# Patient Record
Sex: Male | Born: 1970 | Race: Black or African American | Hispanic: No | Marital: Single | Smoking: Former smoker
Health system: Southern US, Community
[De-identification: ages and names within clinical notes are randomized; demographics above are authoritative.]

## PROBLEM LIST (undated history)

## (undated) DIAGNOSIS — E785 Hyperlipidemia, unspecified: Secondary | ICD-10-CM

## (undated) DIAGNOSIS — I10 Essential (primary) hypertension: Secondary | ICD-10-CM

## (undated) DIAGNOSIS — G7100 Muscular dystrophy, unspecified: Secondary | ICD-10-CM

## (undated) DIAGNOSIS — I82409 Acute embolism and thrombosis of unspecified deep veins of unspecified lower extremity: Secondary | ICD-10-CM

## (undated) HISTORY — PX: HIP SURGERY: SHX245

---

## 2014-07-20 ENCOUNTER — Emergency Department (HOSPITAL_BASED_OUTPATIENT_CLINIC_OR_DEPARTMENT_OTHER): Payer: Medicare Other

## 2014-07-20 ENCOUNTER — Emergency Department (HOSPITAL_BASED_OUTPATIENT_CLINIC_OR_DEPARTMENT_OTHER)
Admission: EM | Admit: 2014-07-20 | Discharge: 2014-07-20 | Disposition: A | Discharge status: Home / Self Care | Payer: Medicare Other | Attending: Emergency Medicine | Admitting: Emergency Medicine

## 2014-07-20 ENCOUNTER — Encounter (HOSPITAL_BASED_OUTPATIENT_CLINIC_OR_DEPARTMENT_OTHER): Payer: Self-pay | Admitting: Emergency Medicine

## 2014-07-20 DIAGNOSIS — M25462 Effusion, left knee: Secondary | ICD-10-CM

## 2014-07-20 DIAGNOSIS — F17200 Nicotine dependence, unspecified, uncomplicated: Secondary | ICD-10-CM | POA: Diagnosis not present

## 2014-07-20 DIAGNOSIS — Z86718 Personal history of other venous thrombosis and embolism: Secondary | ICD-10-CM | POA: Diagnosis not present

## 2014-07-20 DIAGNOSIS — Z7901 Long term (current) use of anticoagulants: Secondary | ICD-10-CM | POA: Diagnosis not present

## 2014-07-20 DIAGNOSIS — Z8669 Personal history of other diseases of the nervous system and sense organs: Secondary | ICD-10-CM | POA: Diagnosis not present

## 2014-07-20 DIAGNOSIS — M25469 Effusion, unspecified knee: Secondary | ICD-10-CM | POA: Diagnosis not present

## 2014-07-20 DIAGNOSIS — M25569 Pain in unspecified knee: Secondary | ICD-10-CM | POA: Diagnosis present

## 2014-07-20 HISTORY — DX: Muscular dystrophy, unspecified: G71.00

## 2014-07-20 HISTORY — DX: Acute embolism and thrombosis of unspecified deep veins of unspecified lower extremity: I82.409

## 2014-07-20 MED ORDER — OXYCODONE-ACETAMINOPHEN 5-325 MG PO TABS
ORAL_TABLET | ORAL | Status: DC
Start: 2014-07-20 — End: 2016-10-20

## 2014-07-20 MED ORDER — LIDOCAINE-EPINEPHRINE 2 %-1:100000 IJ SOLN
20.0000 mL | Freq: Once | INTRAMUSCULAR | Status: DC
  Filled 2014-07-20: qty 1

## 2014-07-20 NOTE — Discharge Instructions (Signed)
Use your wheelchair and try not to bear weight on the left knee.  Take percocet for breakthrough pain, do not drink alcohol, drive, care for children or do other critical tasks while taking percocet.  Please follow with your primary care doctor in the next 2 days for a check-up. They must obtain records for further management.   Do not hesitate to return to the Emergency Department for any new, worsening or concerning symptoms.   Knee Effusion The medical term for having fluid in your knee is effusion. This is often due to an internal derangement of the knee. This means something is wrong inside the knee. Some of the causes of fluid in the knee may be torn cartilage, a torn ligament, or bleeding into the joint from an injury. Your knee is likely more difficult to bend and move. This is often because there is increased pain and pressure in the joint. The time it takes for recovery from a knee effusion depends on different factors, including:   Type of injury.  Your age.  Physical and medical conditions.  Rehabilitation Strategies. How long you will be away from your normal activities will depend on what kind of knee problem you have and how much damage is present. Your knee has two types of cartilage. Articular cartilage covers the bone ends and lets your knee bend and move smoothly. Two menisci, thick pads of cartilage that form a rim inside the joint, help absorb shock and stabilize your knee. Ligaments bind the bones together and support your knee joint. Muscles move the joint, help support your knee, and take stress off the joint itself. CAUSES  Often an effusion in the knee is caused by an injury to one of the menisci. This is often a tear in the cartilage. Recovery after a meniscus injury depends on how much meniscus is damaged and whether you have damaged other knee tissue. Small tears may heal on their own with conservative treatment. Conservative means rest, limited weight bearing  activity and muscle strengthening exercises. Your recovery may take up to 6 weeks.  TREATMENT  Larger tears may require surgery. Meniscus injuries may be treated during arthroscopy. Arthroscopy is a procedure in which your surgeon uses a small telescope like instrument to look in your knee. Your caregiver can make a more accurate diagnosis (learning what is wrong) by performing an arthroscopic procedure. If your injury is on the inner margin of the meniscus, your surgeon may trim the meniscus back to a smooth rim. In other cases your surgeon will try to repair a damaged meniscus with stitches (sutures). This may make rehabilitation take longer, but may provide better long term result by helping your knee keep its shock absorption capabilities. Ligaments which are completely torn usually require surgery for repair. HOME CARE INSTRUCTIONS  Use crutches as instructed.  If a brace is applied, use as directed.  Once you are home, an ice pack applied to your swollen knee may help with discomfort and help decrease swelling.  Keep your knee raised (elevated) when you are not up and around or on crutches.  Only take over-the-counter or prescription medicines for pain, discomfort, or fever as directed by your caregiver.  Your caregivers will help with instructions for rehabilitation of your knee. This often includes strengthening exercises.  You may resume a normal diet and activities as directed. SEEK MEDICAL CARE IF:   There is increased swelling in your knee.  You notice redness, swelling, or increasing pain in your knee.  An unexplained oral temperature above 102 F (38.9 C) develops. SEEK IMMEDIATE MEDICAL CARE IF:   You develop a rash.  You have difficulty breathing.  You have any allergic reactions from medications you may have been given.  There is severe pain with any motion of the knee. MAKE SURE YOU:   Understand these instructions.  Will watch your condition.  Will get  help right away if you are not doing well or get worse. Document Released: 01/21/2004 Document Revised: 01/23/2012 Document Reviewed: 03/26/2008 [facility] Patient Information 2015 [place], [place]. This information is not intended to replace advice given to you by your health care provider. Make sure you discuss any questions you have with your health care provider.

## 2014-07-20 NOTE — ED Provider Notes (Signed)
CSN: [id number]     Arrival date & time 07/20/14  1204 History   First MD Initiated Contact with Patient 07/20/14 1449     Chief Complaint  Patient presents with  . Knee Pain     (Consider location/radiation/quality/duration/timing/severity/associated sxs/prior Treatment) HPI  Arion Shankles is a 43 y.o. male complaining of left knee pain and swelling onset in the last day. Patient denies trauma, reduced range of motion, fever, chills, swelling to other joints, history of issues with the left knee. Patient is anticoagulated for DVT. He had his INR checked 2 hours prior to arrival and states it was 2.6.  Past Medical History  Diagnosis Date  . Muscular dystrophy   . Acute DVT (deep venous thrombosis)    Past Surgical History  Procedure Laterality Date  . Hip surgery     No family history on file. History  Substance Use Topics  . Smoking status: Current Every Day Smoker -- 0.50 packs/day  . Smokeless tobacco: Not on file  . Alcohol Use: Yes    Review of Systems  10 systems reviewed and found to be negative, except as noted in the HPI.  Allergies  Review of patient's allergies indicates no known allergies.  Home Medications   Prior to Admission medications   Medication Sig Start Date End Date Taking? Authorizing Provider  warfarin (COUMADIN) 7.5 MG tablet Take 7.5 mg by mouth daily.   Yes Historical Provider, MD  oxyCODONE-acetaminophen (PERCOCET/ROXICET) 5-325 MG per tablet 1 to 2 tabs PO q6hrs  PRN for pain 07/20/14   Joni Reining Casimiro Lienhard, PA-C   BP 128/69  Pulse 63  Temp(Src) 98.5 F (36.9 C) (Oral)  Resp 16  Ht  (1.626 m)  Wt 200 lb 11.2 oz (91.037 kg)  BMI 34.43 kg/m2  SpO2 98% Physical Exam  Nursing note and vitals reviewed. Constitutional: He is oriented to person, place, and time. He appears well-developed and well-nourished. No distress.  HENT:  Head: Normocephalic and atraumatic.  Mouth/Throat: Oropharynx is clear and moist.  Eyes: Conjunctivae and EOM  are normal. Pupils are equal, round, and reactive to light.  Neck: Normal range of motion.  Cardiovascular: Normal rate, regular rhythm and intact distal pulses.   Pulmonary/Chest: Effort normal and breath sounds normal. No stridor. No respiratory distress. He has no wheezes. He has no rales. He exhibits no tenderness.  Abdominal: Soft. Bowel sounds are normal. He exhibits no distension and no mass. There is no tenderness. There is no rebound and no guarding.  Musculoskeletal: Normal range of motion. He exhibits tenderness. He exhibits no edema.  Large effusion to left knee, full active range of motion with mild pain at extreme flexion. There is trace warmth to the knee with no overlying skin changes or signs of trauma  Neurological: He is alert and oriented to person, place, and time.  Psychiatric: He has a normal mood and affect.    ED Course  Procedures (including critical care time) Labs Review Labs Reviewed - No data to display  Imaging Review Dg Knee 1-2 Views Left  07/20/2014   CLINICAL DATA:  Patient awoke with this morning with left knee pain and swelling. No known injury.  EXAM: LEFT KNEE - 1-2 VIEW  COMPARISON:  None.  FINDINGS: No fracture or dislocation. There is moderate to severe tricompartmental degenerative change of the knee, likely worse within the medial and lateral compartments, with joint space loss, articular surface irregularity, subchondral sclerosis and osteophytosis. No evidence of chondrocalcinosis. There is a moderate  to large sized knee joint effusion. Regional soft tissues appear normal. No radiopaque foreign body.  IMPRESSION: 1. Moderate to large sized knee joint effusion.  No fracture. 2. Moderate to severe tricompartmental degenerative change of the knee.   Electronically Signed   By: Simonne Come M.D.   On: 07/20/2014 12:59     EKG Interpretation None      MDM   Final diagnoses:  Knee effusion, left    Filed Vitals:   07/20/14 1233 07/20/14 1609   BP: 130/84 128/69  Pulse: 56 63  Temp: 98 F (36.7 C) 98.5 F (36.9 C)  TempSrc: Oral Oral  Resp: 20 16  Height:  (1.626 m)   Weight: 200 lb 11.2 oz (91.037 kg)   SpO2: 97% 98%    Kalem Rockwell is a 43 y.o. male presenting with effusion to left knee. No prior history of trauma or issues with his knee. Patient is anticoagulated for DVT. INR taken this morning was therapeutic at 2.6. Patient has excellent range of motion. I doubt this is a septic joint. I am hesitant to tap the joint therapeutically as he is anticoagulated. I will give the patient a referral to orthopedics for further evaluation. Patient has a wheelchair at home and declines crutches. He will be sent home with pain control medications and instructed to return for systemic signs of infection or pain with range of motion.  Evaluation does not show pathology that would require ongoing emergent intervention or inpatient treatment. Pt is hemodynamically stable and mentating appropriately. Discussed findings and plan with patient/guardian, who agrees with care plan. All questions answered. Return precautions discussed and outpatient follow up given.   Discharge Medication List as of 07/20/2014  4:03 PM    START taking these medications   Details  oxyCODONE-acetaminophen (PERCOCET/ROXICET) 5-325 MG per tablet 1 to 2 tabs PO q6hrs  PRN for pain, Print             Wynetta Emery, PA-C 07/22/14 0111

## 2014-07-20 NOTE — ED Notes (Signed)
Pt here for pain and swelling in left knee that began today.  Pt denies any trauma, HX of MD

## 2014-07-23 NOTE — ED Provider Notes (Signed)
Medical screening examination/treatment/procedure(s) were performed by non-physician practitioner and as supervising physician I was immediately available for consultation/collaboration.   EKG Interpretation None        Kiamesha Samet T Yilin Weedon, MD 07/23/14 0716 

## 2015-09-18 IMAGING — CR DG KNEE 1-2V*L*
2 series · 2 of 2 positions shown · non-contrast
Comparison: None.

CLINICAL DATA: Patient awoke with this morning with left knee pain
and swelling. No known injury.

EXAM:
LEFT KNEE - 1-2 VIEW

[t knee ap left]
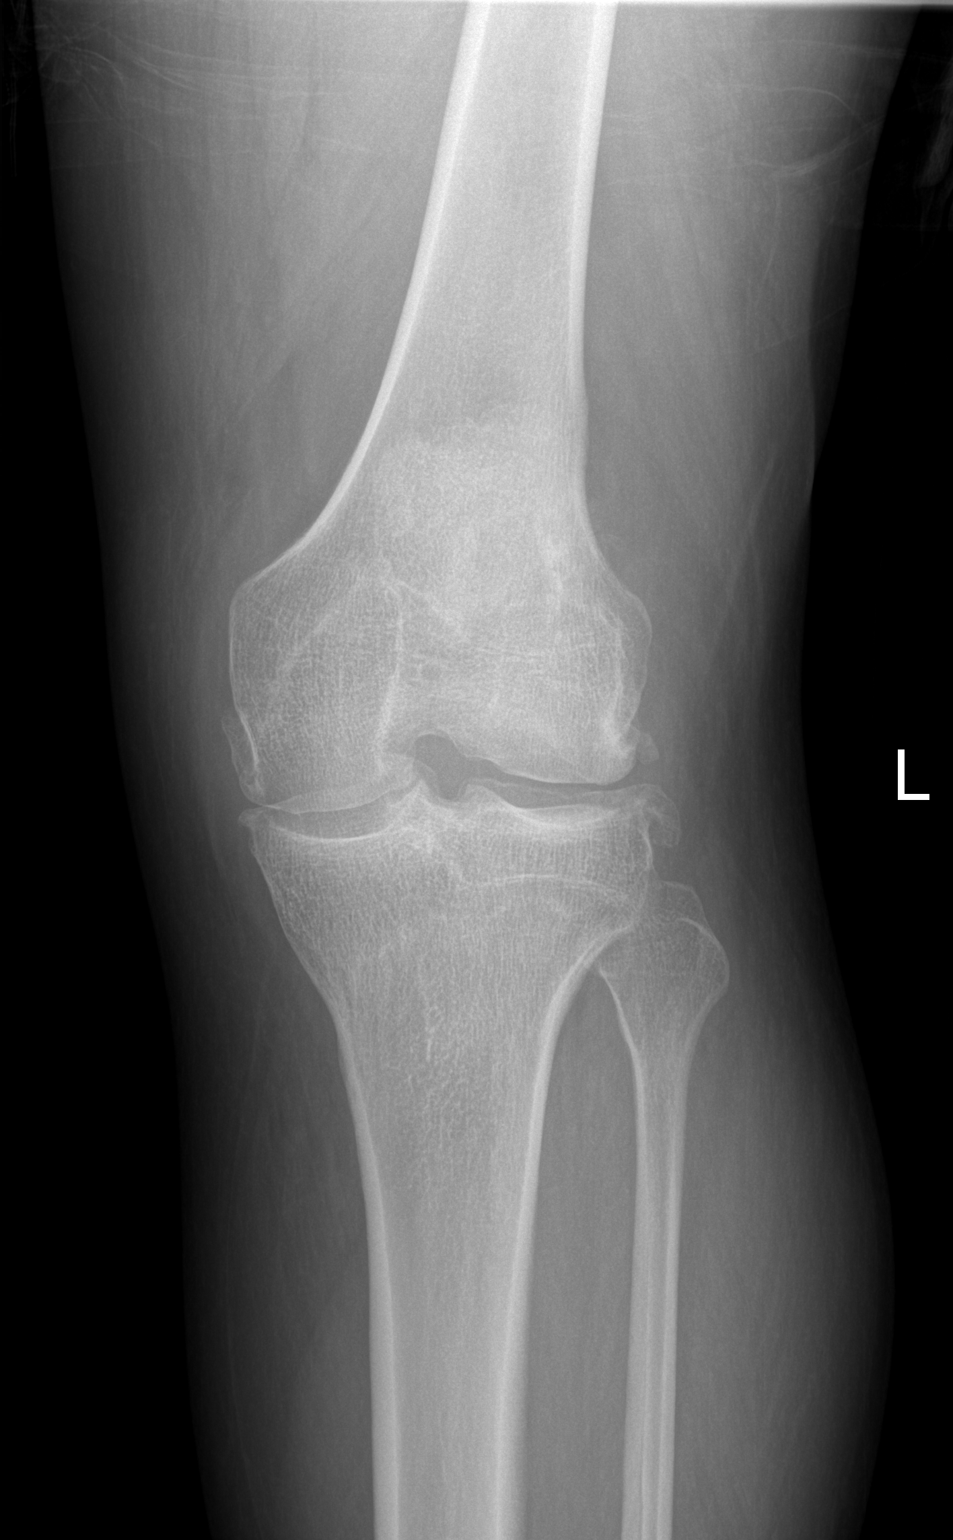

[t knee lat left]
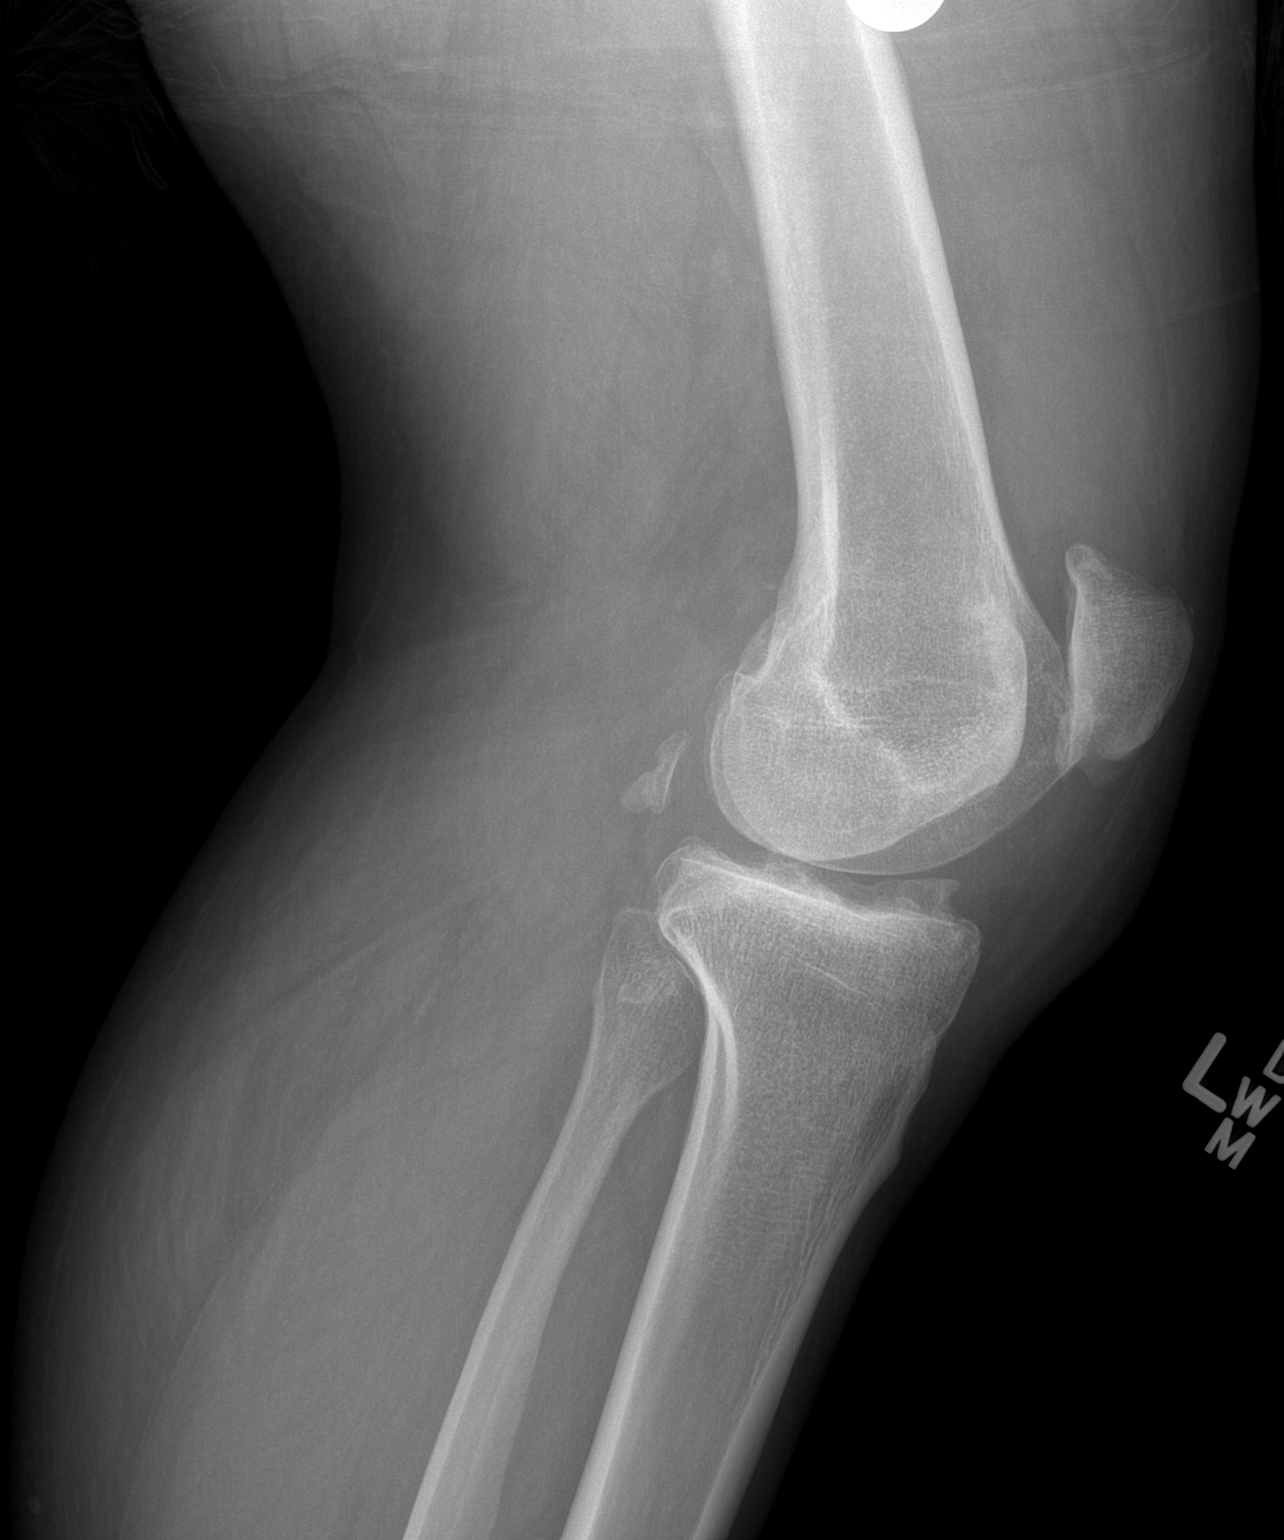

[2 of 2 positions shown; findings below may reference images not displayed]

FINDINGS: No fracture or dislocation. There is moderate to severe
tricompartmental degenerative change of the knee, likely worse
within the medial and lateral compartments, with joint space loss,
articular surface irregularity, subchondral sclerosis and
osteophytosis. No evidence of chondrocalcinosis. There is a moderate
to large sized knee joint effusion. Regional soft tissues appear
normal. No radiopaque foreign body.
IMPRESSION: 1. Moderate to large sized knee joint effusion.  No fracture.
2. Moderate to severe tricompartmental degenerative change of the
knee.

## 2016-05-04 ENCOUNTER — Ambulatory Visit (INDEPENDENT_AMBULATORY_CARE_PROVIDER_SITE_OTHER): Payer: Medicare Other | Admitting: Podiatry

## 2016-05-04 ENCOUNTER — Encounter: Payer: Self-pay | Admitting: Podiatry

## 2016-05-04 VITALS — BP 121/77 | HR 64 | Ht 64.0 in | Wt 176.0 lb

## 2016-05-04 DIAGNOSIS — M79606 Pain in leg, unspecified: Secondary | ICD-10-CM

## 2016-05-04 DIAGNOSIS — L609 Nail disorder, unspecified: Secondary | ICD-10-CM | POA: Diagnosis not present

## 2016-05-04 DIAGNOSIS — M79673 Pain in unspecified foot: Secondary | ICD-10-CM | POA: Diagnosis not present

## 2016-05-04 DIAGNOSIS — L608 Other nail disorders: Secondary | ICD-10-CM

## 2016-05-04 DIAGNOSIS — L089 Local infection of the skin and subcutaneous tissue, unspecified: Secondary | ICD-10-CM | POA: Diagnosis not present

## 2016-05-04 DIAGNOSIS — S90829A Blister (nonthermal), unspecified foot, initial encounter: Secondary | ICD-10-CM

## 2016-05-04 DIAGNOSIS — L570 Actinic keratosis: Secondary | ICD-10-CM | POA: Insufficient documentation

## 2016-05-04 NOTE — Patient Instructions (Signed)
Pain in right foot. Noted of infected callus. Infected callus debrided and dressed under right 2nd MPJ. Keep the dressing intact. Return this Friday for dressing change and nail surgery on 4th toe left foot.

## 2016-05-04 NOTE — Progress Notes (Signed)
SUBJECTIVE: 45 y.o. year old male presents complaining of having severe pain from a lesion under the right foot. The left foot has problem with toe nail on 4th. 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 Was on Warfarin but temporarily off now.   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  OBJECTIVE: DERMATOLOGIC EXAMINATION: Painful hard callus under 2nd MPJ right with pain and swelling. Upon removal of outer layer, pus drained. Pus localized just under the 2nd MPJ right foot. No ascending cellulitis noted. Residual nail growth in circular porokeratotic fashion at lateral borde rof 4th digit left following nail plate removal, done in 2 years ago.  VASCULAR EXAMINATION OF LOWER LIMBS: Pedal pulses: All pedal pulses are palpable with normal pulsation.  Temperature gradient from tibial crest to dorsum of foot is within normal bilateral.  NEUROLOGIC EXAMINATION OF THE LOWER LIMBS: Positive for numbness on both feet.   MUSCULOSKELETAL EXAMINATION: Positive for enlarged first metatarsal head with abducted hallux.  Contracted lesser digits bilateral.  ASSESSMENT: Infected callus right foot. Deformed abnormal nail growth following removal of nail plate 4th left.  PLAN: Infected callus debrided, pus drained under 2nd MPJ right done. Cleansed with Iodine and Iodine gauze dressing applied.  Deformed nails debrided, pain was relieved. Return in 2 days. Will remove abnormal nail growth on left foot on next visit.

## 2016-05-06 ENCOUNTER — Ambulatory Visit: Payer: Medicare Other | Admitting: Podiatry

## 2016-07-07 ENCOUNTER — Encounter: Payer: Self-pay | Admitting: Podiatry

## 2016-07-07 ENCOUNTER — Ambulatory Visit (INDEPENDENT_AMBULATORY_CARE_PROVIDER_SITE_OTHER): Payer: Medicare Other | Admitting: Podiatry

## 2016-07-07 VITALS — BP 109/72 | HR 58

## 2016-07-07 DIAGNOSIS — L97521 Non-pressure chronic ulcer of other part of left foot limited to breakdown of skin: Secondary | ICD-10-CM

## 2016-07-07 DIAGNOSIS — L609 Nail disorder, unspecified: Secondary | ICD-10-CM

## 2016-07-07 DIAGNOSIS — L089 Local infection of the skin and subcutaneous tissue, unspecified: Secondary | ICD-10-CM

## 2016-07-07 DIAGNOSIS — M79606 Pain in leg, unspecified: Secondary | ICD-10-CM

## 2016-07-07 DIAGNOSIS — L608 Other nail disorders: Secondary | ICD-10-CM

## 2016-07-07 DIAGNOSIS — M79673 Pain in unspecified foot: Secondary | ICD-10-CM | POA: Diagnosis not present

## 2016-07-07 NOTE — Patient Instructions (Signed)
Seen for painful nails and calluses. All nails and calluses debrided. Return in 2 months or as needed.

## 2016-07-07 NOTE — Progress Notes (Signed)
SUBJECTIVE: 45 y.o. year old male presents complaining of having severe pain under the 4th toe left and a lesion under the right foot. Hardly can walk due to pain under the toe.  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 Was on Warfarin but temporarily off now.   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  OBJECTIVE: DERMATOLOGIC EXAMINATION: Painful hard callus under 2nd MPJ right with pain, pre ulcerative.  Deformed nail with porokeratotic tissue embedded at lateral corner. No ascending cellulitis noted. Residual nail growth in circular porokeratotic fashion at lateral borde rof 4th digit left following nail plate removal, done in 2 years ago.  VASCULAR EXAMINATION OF LOWER LIMBS: Pedal pulses: All pedal pulses are palpable with normal pulsation.  Temperature gradient from tibial crest to dorsum of foot is within normal bilateral.  NEUROLOGIC EXAMINATION OF THE LOWER LIMBS: Positive for numbness on both feet.   MUSCULOSKELETAL EXAMINATION: Positive for enlarged first metatarsal head with abducted hallux.  Contracted lesser digits bilateral.  ASSESSMENT: Pre ulcerative callus under 2nd MPJ right foot. Deformed abnormal nail growth with severe pain. Thick dystrophic nails x 10.  PLAN: Pre ulcerative callus debrided. Deformed nails debrided, pain was relieved. All nails debrided. Return in 2 month.

## 2016-08-24 ENCOUNTER — Encounter: Payer: Self-pay | Admitting: Podiatry

## 2016-08-24 ENCOUNTER — Ambulatory Visit (INDEPENDENT_AMBULATORY_CARE_PROVIDER_SITE_OTHER): Payer: Medicare Other | Admitting: Podiatry

## 2016-08-24 VITALS — BP 138/83 | HR 58

## 2016-08-24 DIAGNOSIS — B351 Tinea unguium: Secondary | ICD-10-CM | POA: Diagnosis not present

## 2016-08-24 DIAGNOSIS — L97521 Non-pressure chronic ulcer of other part of left foot limited to breakdown of skin: Secondary | ICD-10-CM | POA: Diagnosis not present

## 2016-08-24 DIAGNOSIS — L570 Actinic keratosis: Secondary | ICD-10-CM

## 2016-08-24 DIAGNOSIS — M79673 Pain in unspecified foot: Secondary | ICD-10-CM

## 2016-08-24 DIAGNOSIS — M79606 Pain in leg, unspecified: Secondary | ICD-10-CM

## 2016-08-24 NOTE — Patient Instructions (Signed)
Seen for hypertrophic nails, painful corns and calluses. All nails and calluses (over left bunion area, tip of 4th toe left, ball under 3rd MPJ left, ball under 2nd MPJ) debrided. Return in 2 months or as needed.

## 2016-08-24 NOTE — Progress Notes (Signed)
SUBJECTIVE: 45 y.o.year old malepresents complaining of painful lesion over the left foot bunion, painful calluses on bottom of both feet and nail on 4th toe left. Having difficulty walking due to pain.  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  OBJECTIVE: DERMATOLOGIC EXAMINATION: Painful callus over left bunion, pre ulcerative. Painful hard callus under 2nd MPJ right with pain, pre ulcerative.  Deformed nail with porokeratotic tissue embedded at lateral corner. No open skin lesions. Thick dystrophic nails with fungal debris x 10.  VASCULAR EXAMINATION OF LOWER LIMBS: All pedal pulses are palpable with normal pulsation.  Temperature gradient from tibial crest to dorsum of foot is within normal bilateral.  NEUROLOGIC EXAMINATION OF THE LOWER LIMBS: Positive for numbness on both feet.   MUSCULOSKELETAL EXAMINATION: Severe hallux valgus with bunion painful from skin lesion over the bunion.  Contracted lesser digits bilateral.  ASSESSMENT: Pre ulcerative callus under 2nd MPJ right foot. Pre ulcerative keratotic lesion over left bunion painful in closed in shoes. Deformed abnormal nail growth with severe pain. Thick dystrophic nails x 10.  PLAN: Pre ulcerative callus debrided. All nails debrided. Pain was relieved. Return in 2 month or sooner if needed..Marland Kitchen

## 2016-09-06 ENCOUNTER — Ambulatory Visit: Payer: Medicare Other | Admitting: Podiatry

## 2016-10-11 ENCOUNTER — Ambulatory Visit (INDEPENDENT_AMBULATORY_CARE_PROVIDER_SITE_OTHER): Payer: Medicare Other | Admitting: Podiatry

## 2016-10-11 ENCOUNTER — Encounter: Payer: Self-pay | Admitting: Podiatry

## 2016-10-11 VITALS — BP 130/89 | HR 61 | Wt 180.0 lb

## 2016-10-11 DIAGNOSIS — M21619 Bunion of unspecified foot: Secondary | ICD-10-CM

## 2016-10-11 DIAGNOSIS — M79606 Pain in leg, unspecified: Secondary | ICD-10-CM

## 2016-10-11 DIAGNOSIS — L97521 Non-pressure chronic ulcer of other part of left foot limited to breakdown of skin: Secondary | ICD-10-CM | POA: Diagnosis not present

## 2016-10-11 DIAGNOSIS — M79673 Pain in unspecified foot: Secondary | ICD-10-CM

## 2016-10-11 DIAGNOSIS — M2042 Other hammer toe(s) (acquired), left foot: Secondary | ICD-10-CM

## 2016-10-11 NOTE — Progress Notes (Signed)
SUBJECTIVE: 45 y.o. year old male presents stating that he thinks toes are infected at bunion area and tip of 4th toe left foot.  They are very painful to walk. Patient is seeking a way to prevent further infection and pain. 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 Was on Warfarin but off now.   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  OBJECTIVE: DERMATOLOGIC EXAMINATION: Bleeding callus over left foot bunion. Severely deformed nail at distal lateral 4th digit left. Plantar callus under 2nd MPJ right and under the first MPJ left. VASCULAR EXAMINATION OF LOWER LIMBS: Pedal pulses: All pedal pulses are palpable with normal pulsation.  Temperature gradient from tibial crest to dorsum of foot is within normal bilateral.  NEUROLOGIC EXAMINATION OF THE LOWER LIMBS: Positive for numbness on both feet.   MUSCULOSKELETAL EXAMINATION: Positive for enlarged first metatarsal head with abducted hallux.  Contracted lesser digits with painful distal lesion 4th left.   Radiographic examination reveal enlarged medial aspect of the first metatarsal head L>R, short first metatarsal with increased first intermetatarsal angle bilateral, Fibular sesamoid position at 7 left, 5 right. Contracted lesser digits bilateral.  No soft tissue swelling, no acute osseous changes noted.  ASSESSMENT: Bleeding callus under left foot bunion with dry ulcer. 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  PLAN: All affected lesions debrided. Deformed nails debrided, pain was relieved. [facility] via Flexor tenotomy 4th left foot.

## 2016-10-11 NOTE — Patient Instructions (Signed)
Seen for infected callus. All lesions and nails debrided. Reviewed surgical option for painful bunion and hammer toe 4th digit left foot.  [facility], Flexor tenotomy with excision nail matrix 4th digit left.

## 2016-10-20 ENCOUNTER — Other Ambulatory Visit: Payer: Self-pay | Admitting: Podiatry

## 2016-10-20 MED ORDER — NABUMETONE 500 MG PO TABS: 500.0000 mg | ORAL_TABLET | Freq: Two times a day (BID) | ORAL | 1 refills | Status: AC

## 2016-10-20 MED ORDER — OXYCODONE-ACETAMINOPHEN 5-325 MG PO TABS: ORAL_TABLET | ORAL | 0 refills | Status: AC

## 2016-10-25 ENCOUNTER — Ambulatory Visit: Payer: Medicare Other | Admitting: Podiatry

## 2016-10-27 ENCOUNTER — Encounter: Payer: Medicare Other | Admitting: Podiatry

## 2017-05-30 ENCOUNTER — Ambulatory Visit: Payer: Medicare Other | Admitting: Podiatry

## 2017-07-05 ENCOUNTER — Ambulatory Visit: Payer: Medicare Other | Admitting: Podiatry

## 2017-07-11 ENCOUNTER — Encounter: Payer: Self-pay | Admitting: Podiatry

## 2017-07-11 ENCOUNTER — Ambulatory Visit (INDEPENDENT_AMBULATORY_CARE_PROVIDER_SITE_OTHER): Payer: Medicare Other | Admitting: Podiatry

## 2017-07-11 DIAGNOSIS — M79606 Pain in leg, unspecified: Secondary | ICD-10-CM | POA: Diagnosis not present

## 2017-07-11 DIAGNOSIS — M2042 Other hammer toe(s) (acquired), left foot: Secondary | ICD-10-CM

## 2017-07-11 DIAGNOSIS — B351 Tinea unguium: Secondary | ICD-10-CM | POA: Diagnosis not present

## 2017-07-11 DIAGNOSIS — M21619 Bunion of unspecified foot: Secondary | ICD-10-CM

## 2017-07-11 NOTE — Patient Instructions (Signed)
Seen for painful nails and calluses. All lesions and nails debrided. Return as needed.

## 2017-07-11 NOTE — Progress Notes (Signed)
SUBJECTIVE: 46 y.o.year old malepresents complaining of painful toe nails and calluses.  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 Was on Warfarin but off now.   OBJECTIVE: DERMATOLOGIC EXAMINATION: Thick dystrophic nails x 10. Severely deformed nail at distal lateral 4th digit left. Plantar callus under 2nd MPJ right and under the first MPJ left.  VASCULAR EXAMINATION OF LOWER LIMBS: PAll pedal pulses are palpable with normal pulsation.  Temperature gradient from tibial crest to dorsum of foot is within normal bilateral.  NEUROLOGIC EXAMINATION OF THE LOWER LIMBS: Positive for numbness on both feet.   MUSCULOSKELETAL EXAMINATION: Positive for enlarged first metatarsal head with abducted hallux.  Severe hallux valgus with subluxed first MPJ left. Contracted lesser digits with painful distal lesion 4th left.   ASSESSMENT: Thick dystrophic mycotic nails x 10 Severe plantar calluses under 2nd MPJ bilateral. 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  PLAN: All affected lesions debrided. Deformed nails debrided, pain was relieved. Patient will return as needed.

## 2017-09-14 ENCOUNTER — Ambulatory Visit: Payer: Medicare Other | Admitting: Podiatry

## 2017-12-04 ENCOUNTER — Ambulatory Visit: Payer: Medicare Other | Admitting: Podiatry

## 2017-12-06 ENCOUNTER — Ambulatory Visit (INDEPENDENT_AMBULATORY_CARE_PROVIDER_SITE_OTHER): Payer: Medicare Other | Admitting: Podiatry

## 2017-12-06 ENCOUNTER — Encounter: Payer: Self-pay | Admitting: Podiatry

## 2017-12-06 ENCOUNTER — Ambulatory Visit: Payer: Medicare Other | Admitting: Podiatry

## 2017-12-06 DIAGNOSIS — M79605 Pain in left leg: Secondary | ICD-10-CM | POA: Diagnosis not present

## 2017-12-06 DIAGNOSIS — M21612 Bunion of left foot: Secondary | ICD-10-CM

## 2017-12-06 DIAGNOSIS — M21619 Bunion of unspecified foot: Secondary | ICD-10-CM

## 2017-12-06 DIAGNOSIS — M2042 Other hammer toe(s) (acquired), left foot: Secondary | ICD-10-CM | POA: Diagnosis not present

## 2017-12-06 DIAGNOSIS — L608 Other nail disorders: Secondary | ICD-10-CM

## 2017-12-06 DIAGNOSIS — M79606 Pain in leg, unspecified: Secondary | ICD-10-CM

## 2017-12-06 NOTE — Progress Notes (Signed)
Subjective: 47 y.o. year old male patient presents complaining of painful feet. 4th toe left foot has a growth that is very painful to walk. Patient requests toe nails, corns and calluses trimmed. 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  He had to cancel bunion surgery last year.  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 Was on Warfarin but off now.   Objective: Dermatologic: Thick yellow deformed nails x 10. Porokeratotic lesion mixed with nail matrix at distal lateral 4th left. Multiple plantar porokeratosis bilateral. Vascular: Pedal pulses are all palpable. 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 Severe hallux valgus with bunion on left. Neurologic: All epicritic and tactile sensations grossly intact.  Assessment: Dystrophic mycotic nails x 10. Nail deformity 4th left. HAV with bunion left.  Treatment: All mycotic nails, corns, calluses debrided.  May benefit from office procedure, excision distal lesion 4th left. Patient will schedule office procedure.

## 2017-12-06 NOTE — Patient Instructions (Signed)
Seen for hypertrophic nails, corns and calluses. All nails and painful lesions debrided. May benefit from an office procedure, excision of imbedded nail on 4th toe left foot. Schedule for 30 min office procedure.

## 2017-12-13 ENCOUNTER — Ambulatory Visit: Payer: Medicare Other | Admitting: Podiatry

## 2017-12-19 ENCOUNTER — Ambulatory Visit: Payer: Medicare Other | Admitting: Podiatry

## 2018-08-13 ENCOUNTER — Ambulatory Visit (INDEPENDENT_AMBULATORY_CARE_PROVIDER_SITE_OTHER): Payer: Medicare Other | Admitting: Podiatry

## 2018-08-13 ENCOUNTER — Encounter: Payer: Self-pay | Admitting: Podiatry

## 2018-08-13 DIAGNOSIS — M79606 Pain in leg, unspecified: Secondary | ICD-10-CM | POA: Diagnosis not present

## 2018-08-13 DIAGNOSIS — B351 Tinea unguium: Secondary | ICD-10-CM

## 2018-08-13 NOTE — Patient Instructions (Signed)
Seen for hypertrophic nails and painful calluses. All nails and calluses debrided. Return in 3 months or as needed.  

## 2018-08-13 NOTE — Progress Notes (Signed)
Subjective: 47 y.o. year old male patient presents complaining of painful nails. Patient requests toe nails and calluses trimmed. Denies any new problems.  Objective: Dermatologic: Thick yellow deformed nails x 10. Plantar callus under 2nd MPJ bilateral. Vascular: Pedal pulses are all palpable. Orthopedic: Contracted great toe and lesser digits  Neurologic: All epicritic and tactile sensations grossly intact.  Assessment: Dystrophic mycotic nails x 10. Painful calluses.  Treatment: All mycotic nails, corns, calluses debrided.  Return in 3 months or as needed.

## 2020-07-12 ENCOUNTER — Other Ambulatory Visit: Payer: Self-pay

## 2020-07-12 ENCOUNTER — Encounter (HOSPITAL_BASED_OUTPATIENT_CLINIC_OR_DEPARTMENT_OTHER): Payer: Self-pay

## 2020-07-12 ENCOUNTER — Emergency Department (HOSPITAL_BASED_OUTPATIENT_CLINIC_OR_DEPARTMENT_OTHER)
Admission: EM | Admit: 2020-07-12 | Discharge: 2020-07-12 | Disposition: A | Discharge status: Home / Self Care | Payer: Medicare Other | Attending: Emergency Medicine | Admitting: Emergency Medicine

## 2020-07-12 DIAGNOSIS — F17200 Nicotine dependence, unspecified, uncomplicated: Secondary | ICD-10-CM | POA: Insufficient documentation

## 2020-07-12 DIAGNOSIS — I10 Essential (primary) hypertension: Secondary | ICD-10-CM | POA: Diagnosis present

## 2020-07-12 MED ORDER — HYDROCHLOROTHIAZIDE 12.5 MG PO TABS: 25.0000 mg | ORAL_TABLET | Freq: Every day | ORAL | 0 refills | Status: AC

## 2020-07-12 MED ORDER — HYDROCHLOROTHIAZIDE 25 MG PO TABS
25.0000 mg | ORAL_TABLET | Freq: Once | ORAL | Status: AC
  Administered 2020-07-12: 25 mg via ORAL
  Filled 2020-07-12: qty 1

## 2020-07-12 NOTE — Discharge Instructions (Addendum)
Please read the attachments.  It is important that you increase your physical activity and reduce your sodium intake.  I recommend that you cook more and to eat out less.  It is imperative that you follow-up with your primary care provider regarding today's encounter and for ongoing evaluation and management of your high blood pressure.  It appears as though you have been prescribed HCTZ in the past for your elevated blood pressure.  We will prescribe you a 30-day course, but you will need to follow-up with your primary care provider in the interim.  Return to the ED or seek immediate medical attention should you experience any new or worsening symptoms.

## 2020-07-12 NOTE — ED Provider Notes (Signed)
[facility] EMERGENCY DEPARTMENT Provider Note   CSN: [id number] Arrival date & time: 07/12/20  1438     History Chief Complaint  Patient presents with  . Hypertension    Adam Hicks is a 49 y.o. male with PMH significant for HTN, tobacco use disorder, PE, and muscular dystrophy who presents to the ED with blood pressure complaints.  Patient feels as though his BP has been elevated and has not been on medication recently.  He states that he had been prescribed something by his primary care provider, but that he has not returned for subsequent evaluation or prescription refill.  On my examination, patient states that he received his first COVID-19 vaccination yesterday and was informed that his blood pressure was elevated.  He was advised to follow-up with his primary care provider.  He states that he does have insurance and is still established with [facility], but has not seen them in a long time.  Patient is complaining of injection site soreness and mild fatigue, but no other symptoms.  He denies any recent fevers or chills, headache, blurred vision, chest pain or difficulty breathing, shortness of breath, back pain, abdominal discomfort, numbness or weakness, or other symptoms.  Patient admits that his diet is not particularly good and that he eats out most nights.  He also does not participate in as much physical exertion as he knows he should.  He endorses a history of tobacco use disorder, but states that he is making a concerted effort to quit.  HPI     Past Medical History:  Diagnosis Date  . Acute DVT (deep venous thrombosis) (HCC)   . Muscular dystrophy [facility])     Patient Active Problem List   Diagnosis Date Noted  . Blister of foot with infection 05/04/2016  . Keratoma 05/04/2016    Past Surgical History:  Procedure Laterality Date  . HIP SURGERY         History reviewed. No pertinent family history.  Social History   Tobacco Use  . Smoking  status: Current Every Day Smoker    Packs/day: 0.50  . Smokeless tobacco: Never Used  Substance Use Topics  . Alcohol use: Yes    Alcohol/week: 0.0 standard drinks  . Drug use: Not on file    Home Medications Prior to Admission medications   Medication Sig Start Date End Date Taking? Authorizing Provider  hydrochlorothiazide (HYDRODIURIL) 12.5 MG tablet Take 2 tablets (25 mg total) by mouth daily. 07/12/20   Lorelee New, PA-C  nabumetone (RELAFEN) 500 MG tablet Take 1 tablet (500 mg total) by mouth 2 (two) times daily. 10/20/16   Sheard, Myeong O, DPM  oxyCODONE-acetaminophen (PERCOCET/ROXICET) 5-325 MG tablet 1 to 2 tabs PO q6hrs  PRN for pain 10/20/16   Sheard, Myeong O, DPM  warfarin (COUMADIN) 7.5 MG tablet Take 7.5 mg by mouth daily. Reported on 05/04/2016    [provider]    Allergies    Patient has no known allergies.  Review of Systems   Review of Systems  Physical Exam Updated Vital Signs BP (!) 170/120 (BP Location: Right Arm)   Pulse 78   Temp 99 F (37.2 C) (Oral)   Resp 18   Ht 5\' 4"  (1.626 m)   Wt 74.8 kg   SpO2 100%   BMI 28.32 kg/m   Physical Exam  ED Results / Procedures / Treatments   Labs (all labs ordered are listed, but only abnormal results are displayed) Labs Reviewed - No  data to display  EKG None  Radiology No results found.  Procedures Procedures (including critical care time)  Medications Ordered in ED Medications  hydrochlorothiazide (HYDRODIURIL) tablet 25 mg (25 mg Oral Given 07/12/20 1624)    ED Course  I have reviewed the triage vital signs and the nursing notes.  Pertinent labs & imaging results that were available during my care of the patient were reviewed by me and considered in my medical decision making (see chart for details).    MDM Rules/Calculators/A&P                          I reviewed patient's medical record and he had an initial consult with Dr. Sharion Dove on 03/11/2019 for hypertension evaluation.   He was subsequently placed on HCTZ 12.5 mg daily x90 days.  He has not been since evaluated for blood pressure related concerns.  On patient's arrival to the ED, he was hypertensive to 170/120.  He reports that he has been eating high sodium soup for the past 24 hours since receiving the COVID-19 vaccination.  We discussed lifestyle modifications.  I feel as though it is reasonable to discharge patient home with his previously prescribed 12.5 mg HCTZ medications.  I emphasized the importance of following up with primary care provider for continuity of care and blood pressure recheck.  Patient denies any history of CKD and do not feel as though laboratory work-up is absolutely warranted before initiation of previously prescribed medication.  Strict ED return precautions discussed with patient.  Patient voices understanding prescription plan.  The patient was counseled on the dangers of tobacco use, and was advised to quit.  Reviewed strategies to maximize success, including removing cigarettes and smoking materials from environment, stress management, substitution of other forms of reinforcement, support of family/friends and written materials. Total time was 5 min CPT code 19509.   Final Clinical Impression(s) / ED Diagnoses Final diagnoses:  Hypertension, unspecified type    Rx / DC Orders ED Discharge Orders         Ordered    hydrochlorothiazide (HYDRODIURIL) 12.5 MG tablet  Daily        07/12/20 1555           Elvera Maria 07/12/20 1636    Alvira Monday, MD 07/13/20 1432

## 2020-07-12 NOTE — ED Triage Notes (Addendum)
Pt states has been feeling fatigued, headache, states feels like blood pressure is up.  Used to take medication but has not taken in several months.  Does not recall name of medication.  Had covid vaccine today.

## 2020-07-15 ENCOUNTER — Encounter (HOSPITAL_BASED_OUTPATIENT_CLINIC_OR_DEPARTMENT_OTHER): Payer: Self-pay | Admitting: Emergency Medicine

## 2020-07-15 ENCOUNTER — Other Ambulatory Visit: Payer: Self-pay

## 2020-07-15 ENCOUNTER — Emergency Department (HOSPITAL_BASED_OUTPATIENT_CLINIC_OR_DEPARTMENT_OTHER)
Admission: EM | Admit: 2020-07-15 | Discharge: 2020-07-15 | Disposition: A | Discharge status: Home / Self Care | Payer: Medicare Other | Attending: Emergency Medicine | Admitting: Emergency Medicine

## 2020-07-15 DIAGNOSIS — R5383 Other fatigue: Secondary | ICD-10-CM | POA: Diagnosis present

## 2020-07-15 DIAGNOSIS — U071 COVID-19: Secondary | ICD-10-CM | POA: Diagnosis not present

## 2020-07-15 DIAGNOSIS — F17200 Nicotine dependence, unspecified, uncomplicated: Secondary | ICD-10-CM | POA: Diagnosis not present

## 2020-07-15 DIAGNOSIS — Z79899 Other long term (current) drug therapy: Secondary | ICD-10-CM | POA: Diagnosis not present

## 2020-07-15 DIAGNOSIS — E860 Dehydration: Secondary | ICD-10-CM

## 2020-07-15 LAB — COMPREHENSIVE METABOLIC PANEL
ALT: 40 U/L (ref 0–44)
AST: 81 U/L — ABNORMAL HIGH (ref 15–41)
Albumin: 3.6 g/dL (ref 3.5–5.0)
Alkaline Phosphatase: 62 U/L (ref 38–126)
Anion gap: 14 (ref 5–15)
BUN: 16 mg/dL (ref 6–20)
CO2: 22 mmol/L (ref 22–32)
Calcium: 8.8 mg/dL — ABNORMAL LOW (ref 8.9–10.3)
Chloride: 94 mmol/L — ABNORMAL LOW (ref 98–111)
Creatinine, Ser: 0.93 mg/dL (ref 0.61–1.24)
GFR calc Af Amer: >60 mL/min (ref >60)
GFR calc non Af Amer: >60 mL/min (ref >60)
Glucose, Bld: 112 mg/dL — ABNORMAL HIGH (ref 70–99)
Potassium: 4 mmol/L (ref 3.5–5.1)
Sodium: 130 mmol/L — ABNORMAL LOW (ref 135–145)
Total Bilirubin: 0.6 mg/dL (ref 0.3–1.2)
Total Protein: 8.6 g/dL — ABNORMAL HIGH (ref 6.5–8.1)

## 2020-07-15 LAB — CBC WITH DIFFERENTIAL/PLATELET
Abs Immature Granulocytes: 0.02 10*3/uL (ref 0.00–0.07)
Basophils Absolute: 0 10*3/uL (ref 0.0–0.1)
Basophils Relative: 0 %
Eosinophils Absolute: 0 10*3/uL (ref 0.0–0.5)
Eosinophils Relative: 0 %
HCT: 52.2 % — ABNORMAL HIGH (ref 39.0–52.0)
Hemoglobin: 17.6 g/dL — ABNORMAL HIGH (ref 13.0–17.0)
Immature Granulocytes: 0 %
Lymphocytes Relative: 22 %
Lymphs Abs: 1 10*3/uL (ref 0.7–4.0)
MCH: 28.2 pg (ref 26.0–34.0)
MCHC: 33.7 g/dL (ref 30.0–36.0)
MCV: 83.5 fL (ref 80.0–100.0)
Monocytes Absolute: 0.7 10*3/uL (ref 0.1–1.0)
Monocytes Relative: 15 %
Neutro Abs: 2.8 10*3/uL (ref 1.7–7.7)
Neutrophils Relative %: 63 %
Platelets: 144 10*3/uL — ABNORMAL LOW (ref 150–400)
RBC: 6.25 MIL/uL — ABNORMAL HIGH (ref 4.22–5.81)
RDW: 14.1 % (ref 11.5–15.5)
Smear Review: NORMAL
WBC: 4.5 10*3/uL (ref 4.0–10.5)
nRBC: 0 % (ref 0.0–0.2)

## 2020-07-15 LAB — LIPASE, BLOOD: Lipase: 19 U/L (ref 11–51)

## 2020-07-15 LAB — SARS CORONAVIRUS 2 BY RT PCR (HOSPITAL ORDER, PERFORMED IN ~~LOC~~ HOSPITAL LAB): SARS Coronavirus 2: POSITIVE — AB

## 2020-07-15 MED ORDER — ONDANSETRON 4 MG PO TBDP: 4.0000 mg | ORAL_TABLET | Freq: Three times a day (TID) | ORAL | 0 refills | Status: AC | PRN

## 2020-07-15 MED ORDER — ONDANSETRON HCL 4 MG/2ML IJ SOLN
4.0000 mg | Freq: Once | INTRAMUSCULAR | Status: AC
  Administered 2020-07-15: 4 mg via INTRAVENOUS
  Filled 2020-07-15: qty 2

## 2020-07-15 MED ORDER — SODIUM CHLORIDE 0.9 % IV BOLUS
1000.0000 mL | Freq: Once | INTRAVENOUS | Status: AC
  Administered 2020-07-15: 1000 mL via INTRAVENOUS

## 2020-07-15 NOTE — Discharge Instructions (Addendum)
It is important you stay hydrated. You can eat a clear liquid diet until your symptoms feel better.  Please follow closely with your primary care regarding your symptoms today and to recheck your electrolytes.

## 2020-07-15 NOTE — ED Triage Notes (Signed)
Pt states he received his first covid vaccine on Friday.  Started to feel nauseated, unable to eat.  Some dry heaves.  Pt seen here Sunday for htn.  No diarrhea.  Pt states he feels tired and dehydrated.

## 2020-07-15 NOTE — ED Provider Notes (Signed)
[facility] EMERGENCY DEPARTMENT Provider Note   CSN: [id number] Arrival date & time: 07/15/20  0935     History Chief Complaint  Patient presents with  . Medication Problem    covid vaccine    Adam Hicks is a 49 y.o. male w PMHx muscular dystrophy, presenting with generalized fatigue and nausea that began about 2 days ago. He states he had the 1st covid vaccine on Friday, and thinks symptoms are resulting from this. He reports nausea, a couple episodes of emesis, fatigue. States he feels dehydrated. He denies fever, cough, urinary sx, sore throat. He was seen in the ED on 07/12/2020 for BP medication refill and reported no such symptoms.   The history is provided by the patient and medical records.       Past Medical History:  Diagnosis Date  . Acute DVT (deep venous thrombosis) (HCC)   . Muscular dystrophy [facility])     Patient Active Problem List   Diagnosis Date Noted  . Blister of foot with infection 05/04/2016  . Keratoma 05/04/2016    Past Surgical History:  Procedure Laterality Date  . HIP SURGERY         No family history on file.  Social History   Tobacco Use  . Smoking status: Current Every Day Smoker    Packs/day: 0.50  . Smokeless tobacco: Never Used  Substance Use Topics  . Alcohol use: Yes    Alcohol/week: 0.0 standard drinks  . Drug use: Not on file    Home Medications Prior to Admission medications   Medication Sig Start Date End Date Taking? Authorizing Provider  hydrochlorothiazide (HYDRODIURIL) 12.5 MG tablet Take 2 tablets (25 mg total) by mouth daily. 07/12/20   Lorelee New, PA-C  nabumetone (RELAFEN) 500 MG tablet Take 1 tablet (500 mg total) by mouth 2 (two) times daily. 10/20/16   Sheard, Myeong O, DPM  oxyCODONE-acetaminophen (PERCOCET/ROXICET) 5-325 MG tablet 1 to 2 tabs PO q6hrs  PRN for pain 10/20/16   Sheard, Myeong O, DPM  warfarin (COUMADIN) 7.5 MG tablet Take 7.5 mg by mouth daily. Reported on 05/04/2016    [provider]    Allergies    Patient has no known allergies.  Review of Systems   Review of Systems  Constitutional: Positive for fatigue.  Gastrointestinal: Positive for nausea and vomiting.  All other systems reviewed and are negative.   Physical Exam Updated Vital Signs BP 112/89 (BP Location: Right Arm)   Pulse 92   Temp 98.7 F (37.1 C) (Oral)   Resp 16   Ht 5\' 4"  (1.626 m)   Wt 88.5 kg   SpO2 93%   BMI 33.47 kg/m   Physical Exam Vitals and nursing note reviewed.  Constitutional:      General: He is not in acute distress.    Appearance: He is well-developed.  HENT:     Head: Normocephalic and atraumatic.  Eyes:     Conjunctiva/sclera: Conjunctivae normal.  Cardiovascular:     Rate and Rhythm: Normal rate and regular rhythm.  Pulmonary:     Effort: Pulmonary effort is normal. No respiratory distress.     Breath sounds: Normal breath sounds.  Abdominal:     General: Bowel sounds are normal.     Palpations: Abdomen is soft.     Tenderness: There is no abdominal tenderness. There is no guarding or rebound.  Skin:    General: Skin is warm.  Neurological:     Mental Status: He  is alert.  Psychiatric:        Behavior: Behavior normal.     ED Results / Procedures / Treatments   Labs (all labs ordered are listed, but only abnormal results are displayed) Labs Reviewed  COMPREHENSIVE METABOLIC PANEL - Abnormal; Notable for the following components:      Result Value   Sodium 130 (*)    Chloride 94 (*)    Glucose, Bld 112 (*)    Calcium 8.8 (*)    Total Protein 8.6 (*)    AST 81 (*)    All other components within normal limits  CBC WITH DIFFERENTIAL/PLATELET - Abnormal; Notable for the following components:   RBC 6.25 (*)    Hemoglobin 17.6 (*)    HCT 52.2 (*)    Platelets 144 (*)    All other components within normal limits  SARS CORONAVIRUS 2 BY RT PCR (HOSPITAL ORDER, PERFORMED IN [facility])  LIPASE, BLOOD     EKG None  Radiology No results found.  Procedures Procedures (including critical care time)  Medications Ordered in ED Medications  ondansetron (ZOFRAN) injection 4 mg (4 mg Intravenous Given 07/15/20 1241)  sodium chloride 0.9 % bolus 1,000 mL (0 mLs Intravenous Stopped 07/15/20 1413)  sodium chloride 0.9 % bolus 1,000 mL (1,000 mLs Intravenous New Bag/Given 07/15/20 1438)    ED Course  I have reviewed the triage vital signs and the nursing notes.  Pertinent labs & imaging results that were available during my care of the patient were reviewed by me and considered in my medical decision making (see chart for details).    MDM Rules/Calculators/A&P                          Patient presenting for nausea and fatigue after receiving 1st COVID vaccine. He is afebrile, abdomen is soft and nontender. Lungs are clear. VS are stable. Labs with hyponatremia of 130, AST slightly up to 81, CBC with hemoconcentration- hgb 17.6. IVF given for dehydration, antiemetics for nausea. Patient reports improvement in symptoms following interventions. Pt tolerating PO fluids.  Suspect viral illness, less likely adverse effects of vaccine though it is possible. COVID test sent. Will rx zofran for nausea, recommend PCP follow up. Push fluids. Return precautions discussed.   Final Clinical Impression(s) / ED Diagnoses Final diagnoses:  Dehydration    Rx / DC Orders ED Discharge Orders    None       Haidee Stogsdill, [place] N, PA-C 07/15/20 1524    Melene Plan, DO 07/17/20 1505

## 2020-07-15 NOTE — ED Notes (Signed)
ED Provider at bedside. 

## 2020-07-16 ENCOUNTER — Telehealth: Payer: Self-pay | Admitting: Family

## 2020-07-16 NOTE — Telephone Encounter (Signed)
Called to Discuss with patient about Covid symptoms and the use of the monoclonal antibody infusion for those with mild to moderate Covid symptoms and at a high risk of hospitalization.     Pt appears to qualify for this infusion due to co-morbid conditions and/or a member of an at-risk group in accordance with the FDA Emergency Use Authorization.   Adam Hicks was seen at [facility] ED on 9/1 with generalized fatigue and nausea. He had COVID vaccine on Friday. Symptom onset was 8/30. Risk factors include BMI >25 and hypertension.  Attempted to reach Adam Hicks however he hung up the phone and on call back mailbox was full. No MyChart available.   Marcos Eke, NP 07/16/2020 2:02 PM

## 2022-11-05 ENCOUNTER — Encounter (HOSPITAL_BASED_OUTPATIENT_CLINIC_OR_DEPARTMENT_OTHER): Payer: Self-pay | Admitting: Emergency Medicine

## 2022-11-05 ENCOUNTER — Other Ambulatory Visit: Payer: Self-pay

## 2022-11-05 ENCOUNTER — Emergency Department (HOSPITAL_BASED_OUTPATIENT_CLINIC_OR_DEPARTMENT_OTHER)
Admission: EM | Admit: 2022-11-05 | Discharge: 2022-11-05 | Disposition: A | Discharge status: Home / Self Care | Payer: Medicare Other | Attending: Emergency Medicine | Admitting: Emergency Medicine

## 2022-11-05 ENCOUNTER — Emergency Department (HOSPITAL_BASED_OUTPATIENT_CLINIC_OR_DEPARTMENT_OTHER): Payer: Medicare Other

## 2022-11-05 DIAGNOSIS — R109 Unspecified abdominal pain: Secondary | ICD-10-CM | POA: Diagnosis not present

## 2022-11-05 DIAGNOSIS — Z79899 Other long term (current) drug therapy: Secondary | ICD-10-CM | POA: Insufficient documentation

## 2022-11-05 DIAGNOSIS — Z7901 Long term (current) use of anticoagulants: Secondary | ICD-10-CM | POA: Insufficient documentation

## 2022-11-05 DIAGNOSIS — I10 Essential (primary) hypertension: Secondary | ICD-10-CM | POA: Insufficient documentation

## 2022-11-05 DIAGNOSIS — M549 Dorsalgia, unspecified: Secondary | ICD-10-CM | POA: Insufficient documentation

## 2022-11-05 HISTORY — DX: Essential (primary) hypertension: I10

## 2022-11-05 HISTORY — DX: Hyperlipidemia, unspecified: E78.5

## 2022-11-05 LAB — COMPREHENSIVE METABOLIC PANEL
ALT: 34 U/L (ref 0–44)
AST: 34 U/L (ref 15–41)
Albumin: 3.4 g/dL — ABNORMAL LOW (ref 3.5–5.0)
Alkaline Phosphatase: 71 U/L (ref 38–126)
Anion gap: 9 (ref 5–15)
BUN: 14 mg/dL (ref 6–20)
CO2: 22 mmol/L (ref 22–32)
Calcium: 8.7 mg/dL — ABNORMAL LOW (ref 8.9–10.3)
Chloride: 102 mmol/L (ref 98–111)
Creatinine, Ser: 0.74 mg/dL (ref 0.61–1.24)
GFR, Estimated: >60 mL/min (ref >60)
Glucose, Bld: 129 mg/dL — ABNORMAL HIGH (ref 70–99)
Potassium: 3.6 mmol/L (ref 3.5–5.1)
Sodium: 133 mmol/L — ABNORMAL LOW (ref 135–145)
Total Bilirubin: 0.7 mg/dL (ref 0.3–1.2)
Total Protein: 7.5 g/dL (ref 6.5–8.1)

## 2022-11-05 LAB — URINALYSIS, MICROSCOPIC (REFLEX)

## 2022-11-05 LAB — CBC WITH DIFFERENTIAL/PLATELET
Abs Immature Granulocytes: 0.02 10*3/uL (ref 0.00–0.07)
Basophils Absolute: 0 10*3/uL (ref 0.0–0.1)
Basophils Relative: 1 %
Eosinophils Absolute: 0.2 10*3/uL (ref 0.0–0.5)
Eosinophils Relative: 3 %
HCT: 43.1 % (ref 39.0–52.0)
Hemoglobin: 14.4 g/dL (ref 13.0–17.0)
Immature Granulocytes: 0 %
Lymphocytes Relative: 23 %
Lymphs Abs: 1.4 10*3/uL (ref 0.7–4.0)
MCH: 28.1 pg (ref 26.0–34.0)
MCHC: 33.4 g/dL (ref 30.0–36.0)
MCV: 84.2 fL (ref 80.0–100.0)
Monocytes Absolute: 0.8 10*3/uL (ref 0.1–1.0)
Monocytes Relative: 14 %
Neutro Abs: 3.6 10*3/uL (ref 1.7–7.7)
Neutrophils Relative %: 59 %
Platelets: 181 10*3/uL (ref 150–400)
RBC: 5.12 MIL/uL (ref 4.22–5.81)
RDW: 14 % (ref 11.5–15.5)
WBC: 6 10*3/uL (ref 4.0–10.5)
nRBC: 0 % (ref 0.0–0.2)

## 2022-11-05 LAB — URINALYSIS, ROUTINE W REFLEX MICROSCOPIC
Bilirubin Urine: NEGATIVE
Glucose, UA: NEGATIVE mg/dL
Ketones, ur: NEGATIVE mg/dL
Leukocytes,Ua: NEGATIVE
Nitrite: NEGATIVE
Protein, ur: NEGATIVE mg/dL
Specific Gravity, Urine: 1.025 (ref 1.005–1.030)
pH: 6 (ref 5.0–8.0)

## 2022-11-05 MED ORDER — IBUPROFEN 600 MG PO TABS: 600.0000 mg | ORAL_TABLET | Freq: Four times a day (QID) | ORAL | 0 refills | Status: AC | PRN

## 2022-11-05 MED ORDER — KETOROLAC TROMETHAMINE 15 MG/ML IJ SOLN
15.0000 mg | Freq: Once | INTRAMUSCULAR | Status: AC
  Administered 2022-11-05: 15 mg via INTRAVENOUS
  Filled 2022-11-05: qty 1

## 2022-11-05 NOTE — ED Provider Notes (Signed)
[facility] EMERGENCY DEPARTMENT Provider Note   CSN: [id number] Arrival date & time: 11/05/22  1709     History  Chief Complaint  Patient presents with   Back Pain    Adam Hicks is a 51 y.o. male.  The history is provided by the patient and medical records. No language interpreter was used.  Back Pain    51 year old male with hx of hypertension, hyperlipidemia, DVT presenting today with complaints of back pain.  Patient reports he developed pain to his left flank earlier today.  Pain is achy, constant, nothing seems to make it better or worse and moderate in intensity.  He does not endorse any fever or chills no runny nose sneezing or coughing no chest pain or shortness of breath no dysuria or hematuria no nausea vomiting diarrhea no trouble with his bowel bladder.  He denies any heavy lifting or strenuous activities.  He tries taking Tylenol earlier in the day without relief.  Home Medications Prior to Admission medications   Medication Sig Start Date End Date Taking? Authorizing Provider  hydrochlorothiazide (HYDRODIURIL) 12.5 MG tablet Take 2 tablets (25 mg total) by mouth daily. 07/12/20   Lorelee New, PA-C  nabumetone (RELAFEN) 500 MG tablet Take 1 tablet (500 mg total) by mouth 2 (two) times daily. 10/20/16   Sheard, Myeong O, DPM  ondansetron (ZOFRAN ODT) 4 MG disintegrating tablet Take 1 tablet (4 mg total) by mouth every 8 (eight) hours as needed for nausea or vomiting. 07/15/20   Robinson, [place] N, PA-C  oxyCODONE-acetaminophen (PERCOCET/ROXICET) 5-325 MG tablet 1 to 2 tabs PO q6hrs  PRN for pain 10/20/16   Sheard, Myeong O, DPM  warfarin (COUMADIN) 7.5 MG tablet Take 7.5 mg by mouth daily. Reported on 05/04/2016    [provider]      Allergies    Patient has no known allergies.    Review of Systems   Review of Systems  Musculoskeletal:  Positive for back pain.  All other systems reviewed and are negative.   Physical Exam Updated Vital  Signs BP (!) 121/90 (BP Location: Right Arm)   Pulse 82   Temp 98.6 F (37 C) (Oral)   Resp 20   Ht 5\' 4"  (1.626 m)   Wt 96.6 kg   SpO2 98%   BMI 36.56 kg/m  Physical Exam Vitals and nursing note reviewed.  Constitutional:      General: He is not in acute distress.    Appearance: He is well-developed. He is obese.  HENT:     Head: Atraumatic.  Eyes:     Conjunctiva/sclera: Conjunctivae normal.  Cardiovascular:     Rate and Rhythm: Normal rate and regular rhythm.     Pulses: Normal pulses.     Heart sounds: Normal heart sounds.  Pulmonary:     Effort: Pulmonary effort is normal.     Breath sounds: Normal breath sounds. No wheezing, rhonchi or rales.  Abdominal:     Palpations: Abdomen is soft.     Tenderness: There is no abdominal tenderness. There is no right CVA tenderness or left CVA tenderness.  Musculoskeletal:     Cervical back: Neck supple.  Skin:    Findings: No rash.  Neurological:     Mental Status: He is alert.     ED Results / Procedures / Treatments   Labs (all labs ordered are listed, but only abnormal results are displayed) Labs Reviewed  COMPREHENSIVE METABOLIC PANEL - Abnormal; Notable for the following components:  Result Value   Sodium 133 (*)    Glucose, Bld 129 (*)    Calcium 8.7 (*)    Albumin 3.4 (*)    All other components within normal limits  URINALYSIS, ROUTINE W REFLEX MICROSCOPIC - Abnormal; Notable for the following components:   Hgb urine dipstick TRACE (*)    All other components within normal limits  URINALYSIS, MICROSCOPIC (REFLEX) - Abnormal; Notable for the following components:   Bacteria, UA RARE (*)    All other components within normal limits  CBC WITH DIFFERENTIAL/PLATELET    EKG None  Radiology CT Renal Stone Study  Result Date: 11/05/2022 CLINICAL DATA:  Left-sided abdominal pain. EXAM: CT ABDOMEN AND PELVIS WITHOUT CONTRAST TECHNIQUE: Multidetector CT imaging of the abdomen and pelvis was performed  following the standard protocol without IV contrast. RADIATION DOSE REDUCTION: This exam was performed according to the departmental dose-optimization program which includes automated exposure control, adjustment of the mA and/or kV according to patient size and/or use of iterative reconstruction technique. COMPARISON:  CT abdomen pelvis dated 05/14/2015. FINDINGS: Evaluation of this exam is limited in the absence of intravenous contrast. Lower chest: The visualized lung bases are clear. No intra-abdominal free air or free fluid. Hepatobiliary: No focal liver abnormality is seen. No gallstones, gallbladder wall thickening, or biliary dilatation. Pancreas: Unremarkable. No pancreatic ductal dilatation or surrounding inflammatory changes. Spleen: Normal in size without focal abnormality. Adrenals/Urinary Tract: The adrenal glands are unremarkable. There is no hydronephrosis or nephrolithiasis on either side. The visualized ureters and urinary bladder appear unremarkable. Stomach/Bowel: There are several small scattered distal colonic diverticula without active inflammatory changes. There is no bowel obstruction or active inflammation. The appendix is normal. Vascular/Lymphatic: Mild aortoiliac atherosclerotic disease. The IVC is unremarkable. No portal venous gas. There is no adenopathy. Reproductive: The prostate and seminal vesicles are grossly unremarkable. No pelvic mass. Other: None Musculoskeletal: Total left hip arthroplasty. 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 Old L1 compression and vertebroplasty. No acute osseous pathology. IMPRESSION: 1. No acute intra-abdominal or pelvic pathology. No hydronephrosis or nephrolithiasis. 2. Small scattered distal colonic diverticula. No bowel obstruction. Normal appendix. 3.  Aortic Atherosclerosis (ICD10-I70.0). Electronically Signed   By: Elgie Collard M.D.   On: 11/05/2022 20:07    Procedures Procedures    Medications Ordered in ED Medications  ketorolac  (TORADOL) 15 MG/ML injection 15 mg (15 mg Intravenous Given 11/05/22 1948)    ED Course/ Medical Decision Making/ A&P                           Medical Decision Making Amount and/or Complexity of Data Reviewed Labs: ordered. Radiology: ordered.  Risk Prescription drug management.   BP (!) 121/90 (BP Location: Right Arm)   Pulse 82   Temp 98.6 F (37 C) (Oral)   Resp 20   Ht 5\' 4"  (1.626 m)   Wt 96.6 kg   SpO2 98%   BMI 36.56 kg/m   41:1 PM  51 year old male with hx of hypertension, hyperlipidemia, DVT presenting today with complaints of back pain.  Patient reports he developed pain to his left flank earlier today.  Pain is achy, constant, nothing seems to make it better or worse and moderate in intensity.  He does not endorse any fever or chills no runny nose sneezing or coughing no chest pain or shortness of breath no dysuria or hematuria no nausea vomiting diarrhea no trouble with his bowel bladder.  He denies  any heavy lifting or strenuous activities.  He tries taking Tylenol earlier in the day without relief.  On exam this is an obese male laying in bed in no acute discomfort.  Normal heart and lung exam, abdomen nontender to palpation, no significant midline spine tenderness, no CVA tenderness.  No overlying skin changes.  -Labs ordered, independently viewed and interpreted by me.  Labs remarkable for UA without UTI, WBC is normal at 6 -The patient was maintained on a cardiac monitor.  I personally viewed and interpreted the cardiac monitored which showed an underlying rhythm of: NSR -Imaging independently viewed and interpreted by me and I agree with radiologist's interpretation.  Result remarkable for ct renal stone study without acute changes -This patient presents to the ED for concern of flank pain, this involves an extensive number of treatment options, and is a complaint that carries with it a high risk of complications and morbidity.  The differential diagnosis includes  kidney stone, MSK, dissection, colitis, shingle, viral illness, pyelonephritis -Co morbidities that complicate the patient evaluation includes HTN, HLD -Treatment includes toradol -Reevaluation of the patient after these medicines showed that the patient improved -PCP office notes or outside notes reviewed -Escalation to admission/observation considered: patients feels much better, is comfortable with discharge, and will follow up with PCP -Prescription medication considered, patient comfortable with ibuprofen -Social Determinant of Health considered   9:14 PM Workup today has been overall reassuring, labs normal.  CT recent study obtained and showing no acute finding.  Patient received Toradol and felt better.  At this time he is stable to be discharged home.  Return precaution given.         Final Clinical Impression(s) / ED Diagnoses Final diagnoses:  Left flank pain    Rx / DC Orders ED Discharge Orders          Ordered    ibuprofen (ADVIL) 600 MG tablet  Every 6 hours PRN        11/05/22 2115              Fayrene Helper, PA-C 11/05/22 2120    Tegeler, Canary Brim, MD 11/05/22 2303

## 2022-11-05 NOTE — ED Notes (Signed)
Pt transported to CT ?

## 2022-11-05 NOTE — ED Notes (Signed)
D/c paperwork reviewed with pt, including prescription.  No questions or concerns at time of d/c. Pt ambulatory to ED exit with family.

## 2022-11-05 NOTE — Discharge Instructions (Addendum)
You have been evaluated for your symptoms.  Fortunately your labs and your CT scan today did not show any concerning finding.  You may take ibuprofen as needed for pain.  Return if you develop fever, rash, worsening pain or if you have other concern.

## 2022-11-05 NOTE — ED Triage Notes (Signed)
Patient c/o left sided back pain since this morning, increased pain with movement. Denies any injury or associated n/v.

## 2024-06-29 ENCOUNTER — Emergency Department (HOSPITAL_BASED_OUTPATIENT_CLINIC_OR_DEPARTMENT_OTHER)

## 2024-06-29 ENCOUNTER — Emergency Department (HOSPITAL_BASED_OUTPATIENT_CLINIC_OR_DEPARTMENT_OTHER)
Admission: EM | Admit: 2024-06-29 | Discharge: 2024-06-29 | Disposition: A | Discharge status: Home / Self Care | Attending: Emergency Medicine | Admitting: Emergency Medicine

## 2024-06-29 ENCOUNTER — Encounter (HOSPITAL_BASED_OUTPATIENT_CLINIC_OR_DEPARTMENT_OTHER): Payer: Self-pay | Admitting: Emergency Medicine

## 2024-06-29 ENCOUNTER — Other Ambulatory Visit: Payer: Self-pay

## 2024-06-29 DIAGNOSIS — S92352A Displaced fracture of fifth metatarsal bone, left foot, initial encounter for closed fracture: Secondary | ICD-10-CM | POA: Insufficient documentation

## 2024-06-29 DIAGNOSIS — X501XXA Overexertion from prolonged static or awkward postures, initial encounter: Secondary | ICD-10-CM | POA: Diagnosis not present

## 2024-06-29 DIAGNOSIS — S99922A Unspecified injury of left foot, initial encounter: Secondary | ICD-10-CM | POA: Diagnosis present

## 2024-06-29 DIAGNOSIS — S92342A Displaced fracture of fourth metatarsal bone, left foot, initial encounter for closed fracture: Secondary | ICD-10-CM | POA: Diagnosis not present

## 2024-06-29 MED ORDER — OXYCODONE HCL 5 MG PO TABS: 5.0000 mg | ORAL_TABLET | ORAL | 0 refills | Status: AC | PRN

## 2024-06-29 MED ORDER — HYDROCODONE-ACETAMINOPHEN 5-325 MG PO TABS
1.0000 | ORAL_TABLET | Freq: Once | ORAL | Status: AC
  Administered 2024-06-29: 1 via ORAL
  Filled 2024-06-29: qty 1

## 2024-06-29 NOTE — Discharge Instructions (Signed)
 You were seen for your broken foot in the emergency department.    At home, please keep the boot on until you are able to see the orthopedic surgeons.  Try to use the walker to limit the amount of weight that you put on your foot but you may weight-bear as tolerated.   Take Tylenol  and ibuprofen  for the pain.  You may also take the oxycodone  we have prescribed you for any breakthrough pain that may have.  Do not take this before driving or operating heavy machinery.  Do not take this medication with alcohol.    Follow-up with the orthopedic surgeons within the next week.    Return immediately to the emergency department if you experience any of the following: Severe pain, numbness or weakness of your extremity, or any other concerning symptoms.     Thank you for visiting our Emergency Department. It was a pleasure taking care of you today.

## 2024-06-29 NOTE — ED Triage Notes (Signed)
 Pt c/o left foot pain x 1 week after twisted foot, had improved but woke up today unable to walk on it and mild swelling

## 2024-06-29 NOTE — ED Provider Notes (Signed)
 [facility] AT [facility] [place] Provider Note   CSN: [id number] Arrival date & time: 06/29/24  1740     Patient presents with: Foot Pain   Adam Hicks is a 53 y.o. male.   53 year old male with a history of muscular dystrophy and hip dysplasia with L hip replacement who presents emergency department with left foot pain.  Patient reports he was getting out of the van and twisted his left foot and had some mild pain on it since 1 week ago.  Says that this morning he woke up and started having severe pain and was unable to bear weight on his foot.       Prior to Admission medications   Medication Sig Start Date End Date Taking? Authorizing Provider  oxyCODONE  (ROXICODONE ) 5 MG immediate release tablet Take 1 tablet (5 mg total) by mouth every 4 (four) hours as needed for severe pain (pain score 7-10). 06/29/24  Yes Yolande Lamar BROCKS, MD  hydrochlorothiazide  (HYDRODIURIL ) 12.5 MG tablet Take 2 tablets (25 mg total) by mouth daily. 07/12/20   Landy Honora CROME, PA-C  ibuprofen  (ADVIL ) 600 MG tablet Take 1 tablet (600 mg total) by mouth every 6 (six) hours as needed. 11/05/22   Nivia Colon, PA-C  nabumetone  (RELAFEN ) 500 MG tablet Take 1 tablet (500 mg total) by mouth 2 (two) times daily. 10/20/16   Sheard, Myeong O, DPM  ondansetron  (ZOFRAN  ODT) 4 MG disintegrating tablet Take 1 tablet (4 mg total) by mouth every 8 (eight) hours as needed for nausea or vomiting. 07/15/20   Robinson, Jordan N, PA-C  oxyCODONE -acetaminophen  (PERCOCET/ROXICET) 5-325 MG tablet 1 to 2 tabs PO q6hrs  PRN for pain 10/20/16   Sheard, Myeong O, DPM    Allergies: Patient has no known allergies.    Review of Systems  Updated Vital Signs BP (!) 141/90 (BP Location: Left Arm)   Pulse 96   Temp 99.3 F (37.4 C) (Oral)   Resp 15   Ht 5' 4 (1.626 m)   Wt 96.6 kg   SpO2 97%   BMI 36.56 kg/m   Physical Exam Musculoskeletal:     Comments: Swelling of the dorsum of the left foot.   Compartments of the foot soft.  Intact sensation light touch of all dermatomes of the left foot.  Cap refill less than 2 seconds in all toes.  Patient able to wiggle all toes and dorsiflex and plantarflex his ankle on the left side.  Does have tenderness to palpation along the 4th and 5th metatarsal of the left foot.  DP pulse 2+ on the left foot.     (all labs ordered are listed, but only abnormal results are displayed) Labs Reviewed - No data to display  EKG: None  Radiology: DG Foot Complete Left Result Date: 06/29/2024 CLINICAL DATA:  Left foot pain, injury EXAM: LEFT FOOT - COMPLETE 3+ VIEW COMPARISON:  None Available. FINDINGS: Minimally displaced oblique fractures of the distal fourth and fifth metatarsal shafts. There is also cortical irregularity involving the cuboid suggestive of nondisplaced cuboid fracture. Diffuse soft tissue swelling about the foot. IMPRESSION: Minimally displaced fourth and fifth distal metatarsal shaft fractures and nondisplaced cuboid fracture. Electronically Signed   By: Michaeline Blanch M.D.   On: 06/29/2024 18:07     Procedures   Medications Ordered in the ED  HYDROcodone -acetaminophen  (NORCO/VICODIN) 5-325 MG per tablet 1 tablet (1 tablet Oral Given 06/29/24 1820)    Clinical Course as of 06/29/24 1923  Sat Jun 29, 2024  1854 Dr Kendal from ortho consulted. States that patient can be weight bearing as tolerated.  States this will be nonoperative [RP]    Clinical Course User Index [RP] Yolande Lamar BROCKS, MD                                 Medical Decision Making Amount and/or Complexity of Data Reviewed Radiology: ordered.  Risk Prescription drug management.   53 year old male with history of muscular dystrophy and hip dysplasia with left hip replacement who presents emergency department left foot pain  Initial Ddx:  Foot fracture, compartment syndrome, nerve injury, open fracture, vascular injury  MDM/Course:  Patient resents emergency  department left foot pain.  He did injure it approximately a week ago.  Says that his foot hurt even worse this morning when he woke up.  It is somewhat swollen but his compartments are soft.  Does have tenderness on the lateral aspect of his foot.  Neurovascular intact otherwise.  X-rays do show that he has a fractured 4th and 5th metatarsal along with the cuboid bone.  Patient was having some difficulty getting around without bearing weight so was discussed with orthopedics who feels that he can be weightbearing and can go home in a postop shoe or boot.  He was able to get around with that and a walker as well but because of his muscular dystrophy was having some difficulty.  Unfortunately do not have any wheelchairs here to give him but he feels that he will be able to manage for a day or 2 and does have a son in the area who can help him out.  Did place orders for home health PT as well as social work consult to try to help him out with a wheelchair in the meantime.  He will follow-up with orthopedics in several days as well.    This patient presents to the ED for concern of complaints listed in HPI, this involves an extensive number of treatment options, and is a complaint that carries with it a high risk of complications and morbidity. Disposition including potential need for admission considered.   Dispo: DC Home. Return precautions discussed including, but not limited to, those listed in the AVS. Allowed pt time to ask questions which were answered fully prior to dc.  Additional history obtained from son I independently reviewed the following imaging with scope of interpretation limited to determining acute life threatening conditions related to emergency care: Extremity x-ray(s) and agree with the radiologist interpretation with the following exceptions: none I have reviewed the patients home medications and made adjustments as needed Consults: Orthopedics and TOC  Portions of this note were  generated with Scientist, clinical (histocompatibility and immunogenetics). Dictation errors may occur despite best attempts at proofreading.     Final diagnoses:  Closed displaced fracture of fourth metatarsal bone of left foot, initial encounter  Closed displaced fracture of fifth metatarsal bone of left foot, initial encounter    ED Discharge Orders          Ordered    oxyCODONE  (ROXICODONE ) 5 MG immediate release tablet  Every 4 hours PRN        06/29/24 1902    Face-to-face encounter (required for Medicare/Medicaid patients)       Comments: I Lamar BROCKS Yolande certify that this patient is under my care and that I, or a nurse practitioner or physician's assistant working with me, had  a face-to-face encounter that meets the physician face-to-face encounter requirements with this patient on 06/29/2024. The encounter with the patient was in whole, or in part for the following medical condition(s) which is the primary reason for home health care (List medical condition): Foot fracture and muscular dystrophy   06/29/24 1921               Yolande Lamar BROCKS, MD 06/29/24 [phone]

## 2024-06-30 ENCOUNTER — Telehealth: Payer: Self-pay

## 2024-06-30 NOTE — Telephone Encounter (Signed)
 Patient came in after hours last night for a ankle injury. Consult placed for Home health and wheelchair.  Called patient to follow up.Left a confidential voice message for return call to discuss Of note wheelchair not ordered, the patient will have to go through PCP for this. Can set up [facility] PT if the patient wishes, however he will see a orthopedic this week and they can better assess if this is a need.

## 2024-09-24 ENCOUNTER — Emergency Department (HOSPITAL_BASED_OUTPATIENT_CLINIC_OR_DEPARTMENT_OTHER)
Admission: EM | Admit: 2024-09-24 | Discharge: 2024-09-24 | Disposition: A | Discharge status: Home / Self Care | Payer: No Typology Code available for payment source | Attending: Emergency Medicine | Admitting: Emergency Medicine

## 2024-09-24 ENCOUNTER — Emergency Department (HOSPITAL_BASED_OUTPATIENT_CLINIC_OR_DEPARTMENT_OTHER): Payer: No Typology Code available for payment source

## 2024-09-24 ENCOUNTER — Other Ambulatory Visit: Payer: Self-pay

## 2024-09-24 ENCOUNTER — Encounter (HOSPITAL_BASED_OUTPATIENT_CLINIC_OR_DEPARTMENT_OTHER): Payer: Self-pay

## 2024-09-24 DIAGNOSIS — J40 Bronchitis, not specified as acute or chronic: Secondary | ICD-10-CM | POA: Insufficient documentation

## 2024-09-24 DIAGNOSIS — Z87891 Personal history of nicotine dependence: Secondary | ICD-10-CM | POA: Insufficient documentation

## 2024-09-24 DIAGNOSIS — I10 Essential (primary) hypertension: Secondary | ICD-10-CM | POA: Diagnosis not present

## 2024-09-24 DIAGNOSIS — R059 Cough, unspecified: Secondary | ICD-10-CM | POA: Diagnosis present

## 2024-09-24 LAB — RESP PANEL BY RT-PCR (RSV, FLU A&B, COVID)  RVPGX2
Influenza A by PCR: NEGATIVE
Influenza B by PCR: NEGATIVE
Resp Syncytial Virus by PCR: NEGATIVE
SARS Coronavirus 2 by RT PCR: NEGATIVE

## 2024-09-24 MED ORDER — ALBUTEROL SULFATE HFA 108 (90 BASE) MCG/ACT IN AERS
2.0000 | INHALATION_SPRAY | Freq: Once | RESPIRATORY_TRACT | Status: AC
  Administered 2024-09-24: 2 via RESPIRATORY_TRACT
  Filled 2024-09-24: qty 6.7

## 2024-09-24 MED ORDER — AZITHROMYCIN 250 MG PO TABS: 250.0000 mg | ORAL_TABLET | Freq: Every day | ORAL | 0 refills | Status: AC

## 2024-09-24 MED ORDER — DEXAMETHASONE SOD PHOSPHATE PF 10 MG/ML IJ SOLN
10.0000 mg | Freq: Once | INTRAMUSCULAR | Status: AC
  Administered 2024-09-24: 10 mg via INTRAMUSCULAR

## 2024-09-24 NOTE — Discharge Instructions (Addendum)
 Please follow with your primary care doctor in the coming week.  They may need to arrange a sleep study and consider referring you to a pulmonologist if your symptoms continue.  Return with any new or suddenly worsening symptoms.

## 2024-09-24 NOTE — ED Notes (Signed)
 D/c paperwork reviewed with pt, including prescriptions and follow up care.  All questions and/or concerns addressed at time of d/c.  No further needs expressed. . Pt verbalized understanding, Ambulatory with family to ED exit, NAD.

## 2024-09-24 NOTE — ED Provider Notes (Signed)
 Emergency Department Provider Note   I have reviewed the triage vital signs and the nursing notes.   HISTORY  Chief Complaint Shortness of Breath and Cough   HPI Adam Hicks is a 53 y.o. male with PMH reviewed presents to the ED with cough and congestion. No CP. Symptoms ongoing for the last 2 weeks. No fever. Has been using OTC meds with no lasting relief. Patient does note a prior smoking history.    Past Medical History:  Diagnosis Date   Acute DVT (deep venous thrombosis) (HCC)    Hyperlipidemia    Hypertension    Muscular dystrophy (HCC)     Review of Systems  Constitutional: No fever/chills Cardiovascular: Denies chest pain. Respiratory: Denies shortness of breath. Positive cough.  Gastrointestinal: No abdominal pain.  No nausea, no vomiting.    ____________________________________________   PHYSICAL EXAM:  VITAL SIGNS: ED Triage Vitals  Encounter Vitals Group     BP 09/24/24 2047 (!) 153/88     Pulse Rate 09/24/24 2047 72     Resp 09/24/24 2047 16     Temp 09/24/24 2047 98.6 F (37 C)     Temp Source 09/24/24 2047 Oral     SpO2 09/24/24 2047 98 %     Weight 09/24/24 2047 210 lb (95.3 kg)     Height 09/24/24 2047 5' 4 (1.626 m)   Constitutional: Alert and oriented. Well appearing and in no acute distress. Eyes: Conjunctivae are normal.  Head: Atraumatic. Nose: No congestion/rhinnorhea. Mouth/Throat: Mucous membranes are moist.   Neck: No stridor.   Cardiovascular: Normal rate, regular rhythm. Good peripheral circulation. Grossly normal heart sounds.   Respiratory: Normal respiratory effort.  No retractions. Lungs with faint bilateral end-expiratory wheezing.  Gastrointestinal: Soft and nontender. No distention.  Musculoskeletal: No gross deformities of extremities. Neurologic:  Normal speech and language.  Skin:  Skin is warm, dry and intact. No rash noted.   ____________________________________________   LABS (all labs ordered are listed,  but only abnormal results are displayed)  Labs Reviewed  RESP PANEL BY RT-PCR (RSV, FLU A&B, COVID)  [id number]    ____________________________________________  RADIOLOGY  DG Chest 2 View Result Date: 09/24/2024 EXAM: 2 VIEW(S) XRAY OF THE CHEST 09/24/2024 09:03:42 PM COMPARISON: 08/13/2023 CLINICAL HISTORY: cough cough FINDINGS: LUNGS AND PLEURA: No focal pulmonary opacity. No pulmonary edema. No pleural effusion. No pneumothorax. HEART AND MEDIASTINUM: No acute abnormality of the cardiac and mediastinal silhouettes. BONES AND SOFT TISSUES: No acute osseous abnormality. IMPRESSION: 1. No acute cardiopulmonary process. Electronically signed by: Franky Crease MD 09/24/2024 09:05 PM EST RP Workstation: [id number]    ____________________________________________   PROCEDURES  Procedure(s) performed:   Procedures  None ____________________________________________   INITIAL IMPRESSION / ASSESSMENT AND PLAN / ED COURSE  Pertinent labs & imaging results that were available during my care of the patient were reviewed by me and considered in my medical decision making (see chart for details).   This patient is Presenting for Evaluation of cough, which does require a range of treatment options, and is a complaint that involves a high risk of morbidity and mortality.  The Differential Diagnoses include viral URI, post-viral bacterial PNA, COVID, etc.  Critical Interventions-    Medications  dexamethasone (DECADRON) injection 10 mg (10 mg Intramuscular Given 09/24/24 2202)  albuterol (VENTOLIN HFA) 108 (90 Base) MCG/ACT inhaler 2 puff (2 puffs Inhalation Given 09/24/24 2205)    Reassessment after intervention:  symptoms improved.   Clinical Laboratory Tests Ordered, included OVID PCR  which was negative.   Radiologic Tests Ordered, included CXR. I independently interpreted the images and agree with radiology interpretation.   Cardiac Monitor Tracing which shows NSR.   Medical Decision  Making: Summary:  Patient presents to the ED with wheezing and 2 weeks of cough. Minimal wheezing on exam. Plan for MDI and steroids here in the ED.   Reevaluation with update and discussion with patient. No focal process on CXR. Plan for symptom mgmt and PCP follow up plan.    Patient's presentation is most consistent with acute, uncomplicated illness.   Disposition: discharge  ____________________________________________  FINAL CLINICAL IMPRESSION(S) / ED DIAGNOSES  Final diagnoses:  Bronchitis     NEW OUTPATIENT MEDICATIONS STARTED DURING THIS VISIT:  Discharge Medication List as of 09/24/2024  9:59 PM     START taking these medications   Details  azithromycin (ZITHROMAX) 250 MG tablet Take 1 tablet (250 mg total) by mouth daily. Take first 2 tablets together, then 1 every day until finished., Starting Tue 09/24/2024, Normal        Note:  This document was prepared using Dragon voice recognition software and may include unintentional dictation errors.  Fonda Law, MD, [facility] Emergency Medicine    Aziya Arena, Fonda MATSU, MD 10/01/24 1041

## 2024-09-24 NOTE — ED Triage Notes (Addendum)
 Cough, congestion x 2 weeks Increasingly becoming more SHOB Has been taking OTC meds for symptoms Lungs clear
